# Patient Record
Sex: Female | Born: 1975 | Race: White | Hispanic: No | Marital: Married | Smoking: Never smoker
Health system: Southern US, Community
[De-identification: ages and names within clinical notes are randomized; demographics above are authoritative.]

## PROBLEM LIST (undated history)

## (undated) DIAGNOSIS — R42 Dizziness and giddiness: Secondary | ICD-10-CM

---

## 2000-04-23 ENCOUNTER — Other Ambulatory Visit: Admission: RE | Admit: 2000-04-23 | Discharge: 2000-04-23 | Payer: Self-pay | Admitting: Obstetrics and Gynecology

## 2001-05-25 ENCOUNTER — Other Ambulatory Visit: Admission: RE | Admit: 2001-05-25 | Discharge: 2001-05-25 | Payer: Self-pay | Admitting: Obstetrics and Gynecology

## 2001-05-31 ENCOUNTER — Encounter: Admission: RE | Admit: 2001-05-31 | Discharge: 2001-05-31 | Payer: Self-pay | Admitting: Obstetrics and Gynecology

## 2001-05-31 ENCOUNTER — Encounter: Payer: Self-pay | Admitting: Obstetrics and Gynecology

## 2002-07-05 ENCOUNTER — Other Ambulatory Visit: Admission: RE | Admit: 2002-07-05 | Discharge: 2002-07-05 | Payer: Self-pay | Admitting: Obstetrics and Gynecology

## 2003-08-13 ENCOUNTER — Other Ambulatory Visit: Admission: RE | Admit: 2003-08-13 | Discharge: 2003-08-13 | Payer: Self-pay | Admitting: Obstetrics and Gynecology

## 2004-05-30 ENCOUNTER — Inpatient Hospital Stay (HOSPITAL_COMMUNITY): Admission: AD | Admit: 2004-05-30 | Discharge: 2004-06-03 | Payer: Self-pay | Admitting: Obstetrics and Gynecology

## 2004-05-30 ENCOUNTER — Encounter (INDEPENDENT_AMBULATORY_CARE_PROVIDER_SITE_OTHER): Payer: Self-pay | Admitting: *Deleted

## 2004-07-02 ENCOUNTER — Other Ambulatory Visit: Admission: RE | Admit: 2004-07-02 | Discharge: 2004-07-02 | Payer: Self-pay | Admitting: Obstetrics and Gynecology

## 2005-04-13 ENCOUNTER — Other Ambulatory Visit: Admission: RE | Admit: 2005-04-13 | Discharge: 2005-04-13 | Payer: Self-pay | Admitting: Obstetrics and Gynecology

## 2005-11-04 ENCOUNTER — Inpatient Hospital Stay (HOSPITAL_COMMUNITY): Admission: RE | Admit: 2005-11-04 | Discharge: 2005-11-07 | Payer: Self-pay | Admitting: Obstetrics and Gynecology

## 2010-01-06 ENCOUNTER — Ambulatory Visit: Payer: Self-pay | Admitting: Oncology

## 2010-04-11 ENCOUNTER — Ambulatory Visit: Payer: Self-pay | Admitting: Oncology

## 2010-04-15 LAB — CBC WITH DIFFERENTIAL/PLATELET
BASO%: 0.2 % (ref 0.0–2.0)
Basophils Absolute: 0 10*3/uL (ref 0.0–0.1)
EOS%: 0.6 % (ref 0.0–7.0)
Eosinophils Absolute: 0.1 10*3/uL (ref 0.0–0.5)
HCT: 32.8 % — ABNORMAL LOW (ref 34.8–46.6)
HGB: 11.2 g/dL — ABNORMAL LOW (ref 11.6–15.9)
LYMPH%: 14.2 % (ref 14.0–49.7)
MCH: 31.1 pg (ref 25.1–34.0)
MCHC: 34.1 g/dL (ref 31.5–36.0)
MCV: 91.1 fL (ref 79.5–101.0)
MONO#: 0.4 10*3/uL (ref 0.1–0.9)
MONO%: 4 % (ref 0.0–14.0)
NEUT#: 8.8 10*3/uL — ABNORMAL HIGH (ref 1.5–6.5)
NEUT%: 81 % — ABNORMAL HIGH (ref 38.4–76.8)
Platelets: 102 10*3/uL — ABNORMAL LOW (ref 145–400)
RBC: 3.6 10*6/uL — ABNORMAL LOW (ref 3.70–5.45)
RDW: 13.2 % (ref 11.2–14.5)
WBC: 10.8 10*3/uL — ABNORMAL HIGH (ref 3.9–10.3)
lymph#: 1.5 10*3/uL (ref 0.9–3.3)
nRBC: 0 % (ref 0–0)

## 2010-04-15 LAB — CHCC SMEAR

## 2010-04-15 LAB — LACTATE DEHYDROGENASE: LDH: 128 U/L (ref 94–250)

## 2010-04-23 ENCOUNTER — Encounter (HOSPITAL_COMMUNITY)
Admission: RE | Admit: 2010-04-23 | Discharge: 2010-04-23 | Disposition: A | Discharge status: Home / Self Care | Payer: BC Managed Care – PPO | Source: Ambulatory Visit | Attending: Obstetrics and Gynecology | Admitting: Obstetrics and Gynecology

## 2010-04-23 ENCOUNTER — Other Ambulatory Visit (HOSPITAL_COMMUNITY): Payer: Self-pay

## 2010-04-23 DIAGNOSIS — Z01812 Encounter for preprocedural laboratory examination: Secondary | ICD-10-CM | POA: Insufficient documentation

## 2010-04-28 ENCOUNTER — Other Ambulatory Visit: Payer: Self-pay | Admitting: Obstetrics and Gynecology

## 2010-04-28 ENCOUNTER — Inpatient Hospital Stay (HOSPITAL_COMMUNITY)
Admission: AD | Admit: 2010-04-28 | Discharge: 2010-05-01 | DRG: 370 | Disposition: A | Discharge status: Home / Self Care | Payer: BC Managed Care – PPO | Source: Ambulatory Visit | Attending: Obstetrics and Gynecology | Admitting: Obstetrics and Gynecology

## 2010-04-28 DIAGNOSIS — D689 Coagulation defect, unspecified: Secondary | ICD-10-CM | POA: Diagnosis not present

## 2010-04-28 DIAGNOSIS — D696 Thrombocytopenia, unspecified: Secondary | ICD-10-CM | POA: Diagnosis not present

## 2010-04-28 DIAGNOSIS — O09529 Supervision of elderly multigravida, unspecified trimester: Secondary | ICD-10-CM | POA: Diagnosis present

## 2010-04-28 DIAGNOSIS — O9913 Other diseases of the blood and blood-forming organs and certain disorders involving the immune mechanism complicating the puerperium: Secondary | ICD-10-CM | POA: Diagnosis not present

## 2010-04-28 DIAGNOSIS — Z01812 Encounter for preprocedural laboratory examination: Secondary | ICD-10-CM

## 2010-04-28 DIAGNOSIS — O34219 Maternal care for unspecified type scar from previous cesarean delivery: Principal | ICD-10-CM | POA: Diagnosis present

## 2010-04-28 DIAGNOSIS — Z302 Encounter for sterilization: Secondary | ICD-10-CM

## 2010-04-28 LAB — CBC
HCT: 33.6 % — ABNORMAL LOW (ref 36.0–46.0)
Hemoglobin: 11.3 g/dL — ABNORMAL LOW (ref 12.0–15.0)
MCH: 30.8 pg (ref 26.0–34.0)
MCHC: 33.6 g/dL (ref 30.0–36.0)
MCV: 91.6 fL (ref 78.0–100.0)
Platelets: 108 10*3/uL — ABNORMAL LOW (ref 150–400)
RBC: 3.67 MIL/uL — ABNORMAL LOW (ref 3.87–5.11)
RDW: 13.2 % (ref 11.5–15.5)
WBC: 10.3 10*3/uL (ref 4.0–10.5)

## 2010-04-28 LAB — RPR

## 2010-04-28 LAB — ABO/RH: ABO/RH(D): O POS

## 2010-04-28 LAB — SURGICAL PCR SCREEN: Staphylococcus aureus: POSITIVE — AB

## 2010-04-29 LAB — CBC
HCT: 31.8 % — ABNORMAL LOW (ref 36.0–46.0)
MCH: 31.1 pg (ref 26.0–34.0)
MCHC: 33 g/dL (ref 30.0–36.0)
MCV: 94.1 fL (ref 78.0–100.0)
RDW: 13.5 % (ref 11.5–15.5)

## 2010-04-30 ENCOUNTER — Inpatient Hospital Stay (HOSPITAL_COMMUNITY)
Admission: RE | Admit: 2010-04-30 | Payer: BC Managed Care – PPO | Source: Ambulatory Visit | Admitting: Obstetrics and Gynecology

## 2010-04-30 LAB — CBC
HCT: 30 % — ABNORMAL LOW (ref 36.0–46.0)
MCH: 30.9 pg (ref 26.0–34.0)
MCH: 31.2 pg (ref 26.0–34.0)
MCHC: 32.7 g/dL (ref 30.0–36.0)
MCV: 94.6 fL (ref 78.0–100.0)
MCV: 94.5 fL (ref 78.0–100.0)
Platelets: 85 10*3/uL — ABNORMAL LOW (ref 150–400)
Platelets: 103 10*3/uL — ABNORMAL LOW (ref 150–400)
RBC: 3.46 MIL/uL — ABNORMAL LOW (ref 3.87–5.11)
RDW: 13.5 % (ref 11.5–15.5)
RDW: 13.6 % (ref 11.5–15.5)
WBC: 11.7 10*3/uL — ABNORMAL HIGH (ref 4.0–10.5)

## 2010-05-02 LAB — CROSSMATCH
ABO/RH(D): O POS
Unit division: 0

## 2010-05-04 NOTE — Op Note (Signed)
[name], [name]                  ACCOUNT NO.:  [id number]  MEDICAL RECORD NO.:  [id number]           PATIENT TYPE:  I  LOCATION:  9110                          FACILITY:  [facility]  PHYSICIAN:  Guy Sandifer. Henderson Cloud, M.D. DATE OF BIRTH:  [birth year]  DATE OF PROCEDURE:  04/28/2010 DATE OF DISCHARGE:                              OPERATIVE REPORT   PREOPERATIVE DIAGNOSES: 1. Previous cesarean section. 2. Labor. 3. Desires permanent sterilization.  POSTOPERATIVE DIAGNOSES: 1. Previous cesarean section. 2. Labor. 3. Desires permanent sterilization.  PROCEDURES:  Repeat low transverse cesarean section and bilateral tubal ligation.  SURGEON:  Guy Sandifer. Henderson Cloud, MD  ANESTHESIA:  Spinal, Belva Agee, MD  SPECIMENS:  Bilateral fallopian tube segments to Pathology.  ESTIMATED BLOOD LOSS:  750 mL.  FINDINGS:  Viable female infant, Apgars of 9 and nine at 1 and five minutes respectively, birth weight 8 pounds 8 ounces, arterial cord pH 7.29.  ESTIMATED BLOOD LOSS:  750 mL.  INDICATIONS AND CONSENT:  This patient is a 35 year old married white female, G4, P2, with an EDC of May 07, 2010, placing her at 38-5/7 weeks.  Prenatal care was complicated by previous cesarean section x2. She has advanced maternal age and declined screening.  She has also had low platelets with Hematology consultation.  Diagnosis was benign idiopathic thrombocytopenia pregnancy.  She has had no symptoms of bleeding and platelet counts have been above 100,000.  The patient presents to the office with complaints of contractions and spotting. Cervix is gone from closed and thick to 3-cm dilated, completely effaced and a matter of 2 days with bloody show.  Diagnosis of labor is made. Repeat cesarean section is discussed.  Potential risks and complications were discussed preoperatively.  Potential risks and complications were discussed preoperatively including, but not limited to infection, organ damage,  bleeding requiring transfusion of blood products with HIV and hepatitis acquisition, DVT, PE, pneumonia, pelvic pain, painful intercourse.  Permanent sterilization has been reviewed.  Alternate methods of contraception reviewed.  Permanence of the procedure, increased ectopic risk and failure rate have been reviewed.  All questions have been answered and consent is signed on the chart. Preoperative CBC reveals platelets of 105,000.  She is crossed for 2 units packed red blood cells.  PROCEDURE:  The patient was taken to operating room where she was identified, spinal anesthetic was placed and she was placed in the dorsal supine position with a 15 degrees left lateral wedge.  Time-out was undertaken.  She was prepped, Foley catheter was placed.  The bladder was drained.  She was draped in the sterile fashion.  After testing for adequate spinal anesthesia, skin was entered through a Pfannenstiel incision and dissection was carried out in layers to the peritoneum.  Peritoneum was incised and extended superiorly and inferiorly.  Vesicouterine peritoneum was taken down cephalolaterally. Bladder flap was developed and the bladder blade was placed.  Uterus was incised in a low transverse manner and the uterine cavity was entered bluntly with a hemostat.  Clear fluid was noted.  Uterine incision was extended cephalolaterally with fingers.  Vertex was then delivered.  Oronasopharynx were suctioned.  The remainder of the baby was delivered and good cry and tone was noted.  Cord was clamped and cut and the baby was handed to the awaiting pediatrics team.  Placenta was manually delivered.  Uterine cavity was clean.  Uterus was closed in 2 running locking imbricating layers of 0 Monocryl suture which achieves good hemostasis.  Tubes and ovaries were normal bilaterally.  Left fallopian tube was identified from cornu to fimbria.  It was grasped in its ampullary portion with a Babcock clamp.  A knuckle  of tube was then doubly ligated with 2 free ties of 2-0 plain suture.  The intervening knuckle was then sharply resected.  Cautery was used to assure complete hemostasis.  Similar procedure was carried out on the right side and again hemostasis was noted.  The anterior peritoneum was then closed in running fashion with 0 Monocryl suture which was also used to reapproximate the pyramidalis muscle in the midline.  Anterior rectus fascia was closed in running fashion with a 0 looped PDS suture and the skin was closed with a subcuticular 3-0 Vicryl suture.  Dressings were applied.  All counts were correct.  The patient was awakened and taken to recovery room in stable condition.     Guy Sandifer Henderson Cloud, M.D.     JET/MEDQ  D:  04/28/2010  T:  04/29/2010  Job:  [id number]  Electronically Signed by Harold Hedge M.D. on 05/01/2010 08:27:57 AM

## 2010-05-05 NOTE — Discharge Summary (Signed)
[name], [name]                  ACCOUNT NO.:  [id number]  MEDICAL RECORD NO.:  [id number]           PATIENT TYPE:  I  LOCATION:  9110                          [facility]ACILITY:  [facility]  PHYSICIAN:  Duke Salvia. Marcelle Overlie, M.D.DATE OF BIRTH:  [birth year]  DATE OF ADMISSION:  04/28/2010 DATE OF DISCHARGE:  05/01/2010                              DISCHARGE SUMMARY   ADMITTING DIAGNOSES: 1. Intrauterine pregnancy at 38-5/7th weeks' estimated gestational     age. 2. Spontaneous onset of labor. 3. Previous cesarean section, desires repeat. 4. Multiparity, desires permanent sterilization.  DISCHARGE DIAGNOSES: 1. Status post low transverse cesarean section. 2. Viable female infant. 3. Permanent sterilization.  PROCEDURES: 1. Repeat low transverse cesarean section. 2. Bilateral tubal ligation.  REASON FOR ADMISSION:  Please see written H and P.  HOSPITAL COURSE:  The patient is a 35 year old white married female gravida 4, para 2, that was admitted to [facility] at 59- 5/7 weeks' estimated gestational age with spontaneous onset of labor. The patient had had a previous cesarean section x2 and did desire repeat.  On admission, vital signs were stable.  Cervix was examined and found to be 3-cm dilated, completely effaced at -2 station.  CBC was drawn at that time and platelets were known to be 105,000.  The patient was then taken to the operating room where spinal anesthesia was administered without difficulty.  A low transverse incision was made with delivery of a viable female infant, weighing 8 pounds 8 ounces with Apgars of 9 at 1 minute and 9 at 5 minutes.  Arterial cord pH was 7.29. Bilateral tubal ligation was performed without difficulty and the patient tolerated the procedure well and was taken to the recovery room in stable condition.  On postoperative day #1, the patient was without complaint.  Vital signs were stable.  Blood pressure was 97/55 to 104/58.  Abdomen was  soft with good return of bowel function.  Fundus was firm and nontender.  Abdominal dressing was noted to have a small amount of drainage noted on the bandage.  Foley had been discontinued. She was voiding well.  Laboratory findings revealed hemoglobin of 10.5 and platelet count was 87,000.  PCA pump was discontinued.  The patient was allowed to shower.  Motrin was held and CBC was ordered for the following morning.  On postoperative day #2, the patient was without complaint.  She was complaining that was she was having some sleepiness on the Percocet.  Vital signs were stable.  Abdomen was soft.  Fundus was firm and nontender.  Incision was clean, dry, and intact with subcuticular closure.  She was ambulating well.  Platelets had been redrawn and known to be down to 85,000.  Once again, Motrin was held and CBC was ordered for later in the afternoon and pain medication was changed to Vicodin.  On postoperative day #3, the patient was without complaint.  Vital signs were stable.  Abdomen was soft.  Fundus was firm and nontender.  Incision was clean, dry, and intact.  Platelets up to 103,000.  Discharge instructions reviewed and the patient was later discharged  home.  CONDITION ON DISCHARGE:  Stable.  DIET:  Regular as tolerated.  ACTIVITY:  No heavy lifting, no driving x2 weeks, no vaginal entry.  FOLLOWUP:  The patient is to follow up in the office in 2 weeks for an incision check.  She is to call for temperature greater than 100 degrees, persistent nausea, vomiting, heavy vaginal bleeding, and/or redness or drainage from incisional site.  DISCHARGE MEDICATIONS: 1. Vicodin #30, 1 p.o. every 4-6 hours p.r.n. 2. Motrin 600 mg every 6 hours. 3. Prenatal vitamins 1 p.o. daily.     Julio Sicks, N.P.   ______________________________ Duke Salvia. Marcelle Overlie, M.D.    CC/MEDQ  D:  05/01/2010  T:  05/01/2010  Job:  [id number]  Electronically Signed by Julio Sicks N.P. on 05/05/2010  09:21:45 AM Electronically Signed by Richarda Overlie M.D. on 05/05/2010 08:26:00 PM

## 2010-06-09 LAB — CBC
HCT: 35.7 % — ABNORMAL LOW (ref 36.0–46.0)
Hemoglobin: 11.6 g/dL — ABNORMAL LOW (ref 12.0–15.0)
MCH: 30.2 pg (ref 26.0–34.0)
MCHC: 32.5 g/dL (ref 30.0–36.0)
MCV: 93 fL (ref 78.0–100.0)
RDW: 13.4 % (ref 11.5–15.5)

## 2010-06-13 ENCOUNTER — Other Ambulatory Visit: Payer: Self-pay | Admitting: Dermatology

## 2010-07-22 ENCOUNTER — Inpatient Hospital Stay (HOSPITAL_COMMUNITY): Admission: AD | Admit: 2010-07-22 | Payer: Self-pay | Source: Home / Self Care | Admitting: Obstetrics and Gynecology

## 2010-08-08 NOTE — Op Note (Signed)
NAMECAITRIONA, [name]                  ACCOUNT NO.:  [id number]   MEDICAL RECORD NO.:  [id number]          PATIENT TYPE:  INP   LOCATION:  9132                          FACILITY:  [facility]   PHYSICIAN:  Zelphia Cairo, MD    DATE OF BIRTH:  13-Jan-1976   DATE OF PROCEDURE:  11/04/2005  DATE OF DISCHARGE:                                 OPERATIVE REPORT   PREOPERATIVE DIAGNOSES:  1. Failed vaginal birth after cesarean.  2. Failure to descend.   POSTOPERATIVE DIAGNOSES:  1. Failed vaginal birth after cesarean.  2. Failure to descend.   PROCEDURE:  Repeat low transverse cesarean delivery.   SURGEON:  Dr. Renaldo Fiddler.   ESTIMATED BLOOD LOSS:  800 mL.   URINE OUTPUT:  250 mL.   COMPLICATIONS:  None.   FINDINGS:  Viable female infant with Apgars of 9 and 9.  Cord pH of 7.27.  Normal appearing pelvic anatomy.   ANESTHESIA:  Epidural.   CONDITION:  Stable  to recovery room.   INDICATIONS:  [name] is 35 year old G2, P1, who presented at 39-plus weeks  for induction of labor.  She was ruptured with clear fluid this morning, and  her cervix changed from 3-4 cm to completely dilated without augmentation.  After pushing for 2 hours, the fetal vertex did not descend past zero  station.  Risks, benefits and alternatives of repeat C-section were  discussed with the patient, and informed consent was obtained.   PROCEDURE:  The patient was taken to the operating room where epidural  anesthesia was found to be adequate.  She was prepped and draped in sterile  fashion.  A Foley catheter was placed.  The skin incision was made with a  scalpel, carried down underlying fascia.  The fascia was incised in the  midline and extended laterally using Mayo scissors.  The fascia was then  grasped superiorly and tented upwards, and dissected off the underlying  rectus muscles using the Bovie.  The fascia was then grasped inferiorly with  Kocher clamps, tented upwards and dissected off using the Bovie.  The  peritoneum was then identified and entered bluntly.  The bladder blade was  inserted and the bladder flap was created.  The uterine incision was then  made with the scalpel and extended bluntly.  The fetal vertex was very  extended and deep in the pelvis.  The fetal head was brought to the incision  and was flexed and delivered through the uterine incision.  The shoulders  and body followed.  The cord was clamped and cut, and the infant was handed  to waiting pediatricians.  The placenta was then removed from the uterus,  and the uterus was cleared of all clots and debris with a clean lap.  The  uterine incision was then closed with chromic in a running locked fashion  and hemostasis was assured.  Uterus was returned to the pelvis.  Peritoneum  was closed with Vicryl and the fascia was closed with looped PDS.  The skin  was then closed with staples.  The patient tolerated the procedure well.  Sponge, lap, needle and instrument counts were correct x2.  She did receive  2 grams of Ancef, and was taken to the recovery room in stable condition.      Zelphia Cairo, MD  Electronically Signed     GA/MEDQ  D:  11/04/2005  T:  11/04/2005  Job:  [id number]

## 2010-08-08 NOTE — Discharge Summary (Signed)
[name], [name]                  ACCOUNT NO.:  [id number]   MEDICAL RECORD NO.:  [id number]          PATIENT TYPE:  INP   LOCATION:  9132                          [facility]ACILITY:  [facility]   PHYSICIAN:  Dineen Kid. Rana Snare, M.D.    DATE OF BIRTH:  1976/03/08   DATE OF ADMISSION:  11/04/2005  DATE OF DISCHARGE:  11/07/2005                                 DISCHARGE SUMMARY   ADMITTING DIAGNOSES:  1. Intrauterine pregnancy at 59 weeks estimated gestational age.  2. Induction of labor.  3. History of previous cesarean section, desires vaginal birth after      cesarean.   DISCHARGE DIAGNOSES:  1. Status post low transverse cesarean section secondary to failure to      descend.  2. Viable female infant.   PROCEDURE:  Repeat low transverse cesarean section.   REASON FOR ADMISSION:  Please see written H&P.   HOSPITAL COURSE:  The patient was a 35 year old gravida 2, para 1 that  presented to [facility] at 39 weeks estimated gestational  age for induction of labor.  The patient had had a previous cesarean section  but desired vaginal birth after cesarean.  On admission cervix was dilated 3-  4 cm, 90%, vertex at -2 station.  Artificial rupture of membranes was  performed with clear fluid.  The patient did progress to complete dilatation  with vertex at a 0 station.  After pushing for approximately 2 hours with no  further descent of the fetal vertex with a moderate amount of molding,  decision was made to proceed with a repeat low transverse cesarean section.  The patient was then transferred to the operating room where epidural was  dosed to an adequate surgical level.  A low transverse incision was made  with the delivery of a viable female infant weighing 8 pounds 4 ounces with  Apgars of 9 at one minute and 9 at five minutes.  The patient tolerated the  procedure well and was taken to the recovery room in stable condition.  On  postoperative day #1 the patient did complain of  some drowsiness.  She had  been given Benadryl for itching.  Vital signs were otherwise stable, she was  afebrile.  Abdomen soft with good return of bowel function.  Fundus was firm  and nontender.  Abdominal dressing was noted to have a small amount of  drainage noted on the bandage.  Laboratory findings showed hemoglobin of  9.8; platelet count was down to 90,000.  Decision was made to hold the  Motrin at this current time, remove the abdominal dressing, and ordered a  CBC for in the morning.  On the following morning the patient was without  complaint.  Vital signs remained stable, she was afebrile.  Fundus firm and  nontender.  Abdominal dressing had been removed revealing an incision that  was clean, dry and intact.  Laboratory findings revealed hemoglobin of 9.9;  platelet count up to 103,000.  On postoperative day #3 the patient was  without complaint.  Vital signs were stable and she was afebrile.  Fundus  firm and nontender.  Incision was clean, dry and intact.  Staples were  removed and the patient was later discharged home.   CONDITION ON DISCHARGE:  Good.   DIET:  Regular as tolerated.   ACTIVITY:  No heavy lifting, no driving x2 weeks, no vaginal entry.   FOLLOWUP:  The patient is to follow up in the office in 1-2 weeks for an  incision check.  She is to call for temperature greater than 100 degrees,  persistent nausea/vomiting, heavy vaginal bleeding, and/or redness or  drainage from the incisional site.   DISCHARGE MEDICATIONS:  1. Percocet 5/325 #30 one p.o. every 4-6 hours p.r.n.  2. Motrin 600 mg every 6 hours.  3. Prenatal vitamins one p.o. daily.      Julio Sicks, N.P.      Dineen Kid Rana Snare, M.D.  Electronically Signed    CC/MEDQ  D:  12/01/2005  T:  12/01/2005  Job:  [id number]

## 2011-03-04 ENCOUNTER — Other Ambulatory Visit: Payer: Self-pay | Admitting: Dermatology

## 2012-03-04 ENCOUNTER — Other Ambulatory Visit: Payer: Self-pay | Admitting: Dermatology

## 2013-05-05 ENCOUNTER — Other Ambulatory Visit: Payer: Self-pay | Admitting: Dermatology

## 2013-05-25 ENCOUNTER — Encounter (HOSPITAL_COMMUNITY): Payer: Self-pay | Admitting: Emergency Medicine

## 2013-05-25 ENCOUNTER — Emergency Department (HOSPITAL_COMMUNITY)
Admission: EM | Admit: 2013-05-25 | Discharge: 2013-05-25 | Disposition: A | Discharge status: Home / Self Care | Payer: BC Managed Care – PPO | Attending: Emergency Medicine | Admitting: Emergency Medicine

## 2013-05-25 DIAGNOSIS — S060X0A Concussion without loss of consciousness, initial encounter: Secondary | ICD-10-CM | POA: Insufficient documentation

## 2013-05-25 DIAGNOSIS — W1809XA Striking against other object with subsequent fall, initial encounter: Secondary | ICD-10-CM | POA: Insufficient documentation

## 2013-05-25 DIAGNOSIS — H8190 Unspecified disorder of vestibular function, unspecified ear: Secondary | ICD-10-CM

## 2013-05-25 DIAGNOSIS — S060XAA Concussion with loss of consciousness status unknown, initial encounter: Secondary | ICD-10-CM

## 2013-05-25 DIAGNOSIS — Y9323 Activity, snow (alpine) (downhill) skiing, snow boarding, sledding, tobogganing and snow tubing: Secondary | ICD-10-CM | POA: Insufficient documentation

## 2013-05-25 DIAGNOSIS — Y929 Unspecified place or not applicable: Secondary | ICD-10-CM | POA: Insufficient documentation

## 2013-05-25 DIAGNOSIS — S060X9A Concussion with loss of consciousness of unspecified duration, initial encounter: Secondary | ICD-10-CM

## 2013-05-25 HISTORY — DX: Dizziness and giddiness: R42

## 2013-05-25 MED ORDER — MECLIZINE HCL 25 MG PO TABS: 25.0000 mg | ORAL_TABLET | Freq: Three times a day (TID) | ORAL | Status: AC | PRN

## 2013-05-25 NOTE — ED Notes (Signed)
Per pt, states she fell while skiing last Friday and hit back of head-suffers from vertigo and thought dizziness was related to her vertigo-called PCP and was told to come here for eval

## 2013-05-25 NOTE — Discharge Instructions (Signed)

## 2013-05-25 NOTE — ED Notes (Signed)
Initial Contact - pt to [facility]5 with c/o dizziness x6 days after a fall while skiing.  Pt reports hitting head, denies LOC, pt sts hx vertigo and symptoms are the same.  Pt sts told to come to ER by PCP.  Neuros grossly intact, ambulatory with a steady gait.  Speaking full/clear sentences, rr even/un-lab.  Skin PWD.  NAD.

## 2013-05-25 NOTE — Progress Notes (Signed)
   CARE MANAGEMENT ED NOTE 05/25/2013  Patient:  [name],[name]   Account Number:  [id number]  Date Initiated:  05/25/2013  Documentation initiated by:  Radford PaxFERRERO,Thaddus Mcdowell  Subjective/Objective Assessment:   Patient presents to Ed with head injury while skiing.     Subjective/Objective Assessment Detail:   Patient complains of vertigo and dizziness     Action/Plan:   Action/Plan Detail:   Anticipated DC Date:  05/25/2013     Status Recommendation to Physician:   Result of Recommendation:    Other ED Services  Consult Working Plan    DC Planning Services  Other  PCP issues    Choice offered to / List presented to:            Status of service:  Completed, signed off  ED Comments:   ED Comments Detail:  Patient confirms her pcp is Dr. Buren Kosouglas Shaw.  System updated.

## 2013-05-25 NOTE — ED Provider Notes (Signed)
CSN: [id number]     Arrival date & time 05/25/13  1353 History   First MD Initiated Contact with Patient 05/25/13 1522     Chief Complaint  Patient presents with  . Dizziness       The history is provided by the patient.   patient presents to the emergency department because of ongoing dizziness and vertiginous symptoms since injuring her head one week ago while skiing.  She fell up proximally 6 days ago while skiing and fell and struck the back of her head.  Chin no loss consciousness.  She is not on anticoagulants.  She was not wearing a helmet at the time.  Since then she states that she's felt chest not quite right and somewhat vertiginous in nature.  She denies significant headache.  She's been able to do her normal activities otherwise.  She's been caring for her children and dressing herself in shower.  She drove to the emergency department today.  She spoke with her primary care doctor's office today and talked with the nurse who recommended that she come to the emergency department because she may require imaging.  She denies weakness in arms or legs.  She denies neck pain.  No other complaints.  She states she's had vertiginous symptoms many times before in the past such as when she goes on a boat.  She's received meclizine before in the past but states this makes her extremely sleepy and she prefers not to take this.  Her mother is a Engineer, civil (consulting)nurse and her mother was concerned about the possibility of an intercranial bleed and thought that she would benefit from imaging as well.  Past Medical History  Diagnosis Date  . Vertigo    Past Surgical History  Procedure Laterality Date  . Cesarean section     No family history on file. History  Substance Use Topics  . Smoking status: Never Smoker   . Smokeless tobacco: Not on file  . Alcohol Use: No   OB History   Grav Para Term Preterm Abortions TAB SAB Ect Mult Living                 Review of Systems  All other systems reviewed and are  negative.      Allergies  Biaxin  Home Medications   Current Outpatient Rx  Name  Route  Sig  Dispense  Refill  . ibuprofen (ADVIL,MOTRIN) 200 MG tablet   Oral   Take 200 mg by mouth every 6 (six) hours as needed for moderate pain.         . Multiple Vitamin (MULTIVITAMIN WITH MINERALS) TABS tablet   Oral   Take 1 tablet by mouth daily.          BP 112/59  Pulse 73  Temp(Src) 98.2 F (36.8 C) (Oral)  Resp 16  SpO2 99%  LMP 05/18/2013 Physical Exam  Nursing note and vitals reviewed. Constitutional: She is oriented to person, place, and time. She appears well-developed and well-nourished. No distress.  HENT:  Head: Normocephalic and atraumatic.  Right Ear: External ear normal.  Left Ear: External ear normal.  Mouth/Throat: Oropharynx is clear and moist.  Eyes: EOM are normal.  Neck: Normal range of motion.  Cardiovascular: Normal rate, regular rhythm and normal heart sounds.   Pulmonary/Chest: Effort normal and breath sounds normal.  Abdominal: Soft. She exhibits no distension. There is no tenderness.  Musculoskeletal: Normal range of motion.  Neurological: She is alert and oriented to person, place,  and time.  Skin: Skin is warm and dry.  Psychiatric: She has a normal mood and affect. Judgment normal.    ED Course  Procedures (including critical care time) Labs Review Labs Reviewed - No data to display Imaging Review No results found.   EKG Interpretation None      MDM   Final diagnoses:  Concussion  Vertiginous syndrome    Concussion with some vertiginous symptoms. Doubt stroke, doubt central vertigo. Doubt intracranial bleed.  Overall the patient is very well appearing.  The patient requested that we attempted Epley maneuver and therefore this was performed.  She had recurrence of her vertiginous symptoms with laying flat and turning her head to the left.  She was given information regarding the Epley maneuver and she will continue these  exercises at home.  She's been given a prescription for meclizine which she may try in the evening time as she states this will also help her sleep.  I've asked that she return to the ER for any new or worsening symptoms and asked that she followup with her primary care physician.    Lyanne Co, MD 05/26/13 [phone]

## 2014-02-21 ENCOUNTER — Other Ambulatory Visit: Payer: Self-pay | Admitting: Obstetrics and Gynecology

## 2014-02-22 LAB — CYTOLOGY - PAP

## 2016-02-12 DIAGNOSIS — Z23 Encounter for immunization: Secondary | ICD-10-CM | POA: Diagnosis not present

## 2016-05-04 DIAGNOSIS — Z6826 Body mass index (BMI) 26.0-26.9, adult: Secondary | ICD-10-CM | POA: Diagnosis not present

## 2016-05-04 DIAGNOSIS — Z1231 Encounter for screening mammogram for malignant neoplasm of breast: Secondary | ICD-10-CM | POA: Diagnosis not present

## 2016-05-04 DIAGNOSIS — Z01419 Encounter for gynecological examination (general) (routine) without abnormal findings: Secondary | ICD-10-CM | POA: Diagnosis not present

## 2016-05-05 ENCOUNTER — Other Ambulatory Visit: Payer: Self-pay | Admitting: Obstetrics and Gynecology

## 2016-05-05 DIAGNOSIS — R928 Other abnormal and inconclusive findings on diagnostic imaging of breast: Secondary | ICD-10-CM

## 2016-05-11 ENCOUNTER — Ambulatory Visit
Admission: RE | Admit: 2016-05-11 | Discharge: 2016-05-11 | Disposition: A | Discharge status: Home / Self Care | Payer: BLUE CROSS/BLUE SHIELD | Source: Ambulatory Visit | Attending: Obstetrics and Gynecology | Admitting: Obstetrics and Gynecology

## 2016-05-11 DIAGNOSIS — R928 Other abnormal and inconclusive findings on diagnostic imaging of breast: Secondary | ICD-10-CM

## 2016-05-11 DIAGNOSIS — N6011 Diffuse cystic mastopathy of right breast: Secondary | ICD-10-CM | POA: Diagnosis not present

## 2016-05-11 DIAGNOSIS — N6311 Unspecified lump in the right breast, upper outer quadrant: Secondary | ICD-10-CM | POA: Diagnosis not present

## 2016-08-12 DIAGNOSIS — D2262 Melanocytic nevi of left upper limb, including shoulder: Secondary | ICD-10-CM | POA: Diagnosis not present

## 2016-08-12 DIAGNOSIS — D2261 Melanocytic nevi of right upper limb, including shoulder: Secondary | ICD-10-CM | POA: Diagnosis not present

## 2016-08-12 DIAGNOSIS — Z85828 Personal history of other malignant neoplasm of skin: Secondary | ICD-10-CM | POA: Diagnosis not present

## 2016-08-12 DIAGNOSIS — L738 Other specified follicular disorders: Secondary | ICD-10-CM | POA: Diagnosis not present

## 2016-12-25 DIAGNOSIS — Z23 Encounter for immunization: Secondary | ICD-10-CM | POA: Diagnosis not present

## 2017-03-01 DIAGNOSIS — M5442 Lumbago with sciatica, left side: Secondary | ICD-10-CM | POA: Diagnosis not present

## 2017-08-25 DIAGNOSIS — N390 Urinary tract infection, site not specified: Secondary | ICD-10-CM | POA: Diagnosis not present

## 2017-11-04 ENCOUNTER — Other Ambulatory Visit: Payer: Self-pay | Admitting: Radiology

## 2017-11-04 DIAGNOSIS — N6320 Unspecified lump in the left breast, unspecified quadrant: Secondary | ICD-10-CM

## 2017-11-09 ENCOUNTER — Ambulatory Visit
Admission: RE | Admit: 2017-11-09 | Discharge: 2017-11-09 | Disposition: A | Discharge status: Home / Self Care | Payer: BLUE CROSS/BLUE SHIELD | Source: Ambulatory Visit | Attending: Radiology | Admitting: Radiology

## 2017-11-09 DIAGNOSIS — N6320 Unspecified lump in the left breast, unspecified quadrant: Secondary | ICD-10-CM

## 2017-11-09 DIAGNOSIS — R922 Inconclusive mammogram: Secondary | ICD-10-CM | POA: Diagnosis not present

## 2017-11-18 DIAGNOSIS — Z6827 Body mass index (BMI) 27.0-27.9, adult: Secondary | ICD-10-CM | POA: Diagnosis not present

## 2017-11-18 DIAGNOSIS — Z01419 Encounter for gynecological examination (general) (routine) without abnormal findings: Secondary | ICD-10-CM | POA: Diagnosis not present

## 2017-12-29 DIAGNOSIS — D225 Melanocytic nevi of trunk: Secondary | ICD-10-CM | POA: Diagnosis not present

## 2017-12-29 DIAGNOSIS — D2262 Melanocytic nevi of left upper limb, including shoulder: Secondary | ICD-10-CM | POA: Diagnosis not present

## 2017-12-29 DIAGNOSIS — Z85828 Personal history of other malignant neoplasm of skin: Secondary | ICD-10-CM | POA: Diagnosis not present

## 2018-03-10 DIAGNOSIS — N390 Urinary tract infection, site not specified: Secondary | ICD-10-CM | POA: Diagnosis not present

## 2018-03-18 DIAGNOSIS — N200 Calculus of kidney: Secondary | ICD-10-CM | POA: Diagnosis not present

## 2018-03-18 DIAGNOSIS — Z881 Allergy status to other antibiotic agents status: Secondary | ICD-10-CM | POA: Diagnosis not present

## 2018-03-18 DIAGNOSIS — N1330 Unspecified hydronephrosis: Secondary | ICD-10-CM | POA: Diagnosis not present

## 2018-03-18 DIAGNOSIS — R109 Unspecified abdominal pain: Secondary | ICD-10-CM | POA: Diagnosis not present

## 2018-03-18 DIAGNOSIS — N201 Calculus of ureter: Secondary | ICD-10-CM | POA: Diagnosis not present

## 2018-10-03 DIAGNOSIS — R82998 Other abnormal findings in urine: Secondary | ICD-10-CM | POA: Diagnosis not present

## 2018-10-03 DIAGNOSIS — Z0000 Encounter for general adult medical examination without abnormal findings: Secondary | ICD-10-CM | POA: Diagnosis not present

## 2018-10-05 DIAGNOSIS — Z0000 Encounter for general adult medical examination without abnormal findings: Secondary | ICD-10-CM | POA: Diagnosis not present

## 2018-10-05 DIAGNOSIS — Z1331 Encounter for screening for depression: Secondary | ICD-10-CM | POA: Diagnosis not present

## 2018-12-09 IMAGING — MG DIGITAL DIAGNOSTIC BILATERAL MAMMOGRAM WITH TOMO AND CAD
6 of 12 series · 6 of 36 positions shown · non-contrast
Comparison: Previous exam(s).

CLINICAL DATA: Palpable lump in the left breast

EXAM:
DIGITAL DIAGNOSTIC BILATERAL MAMMOGRAM WITH TOMO
ULTRASOUND LEFT BREAST

[R MLO synth-2D (1 of 2)]
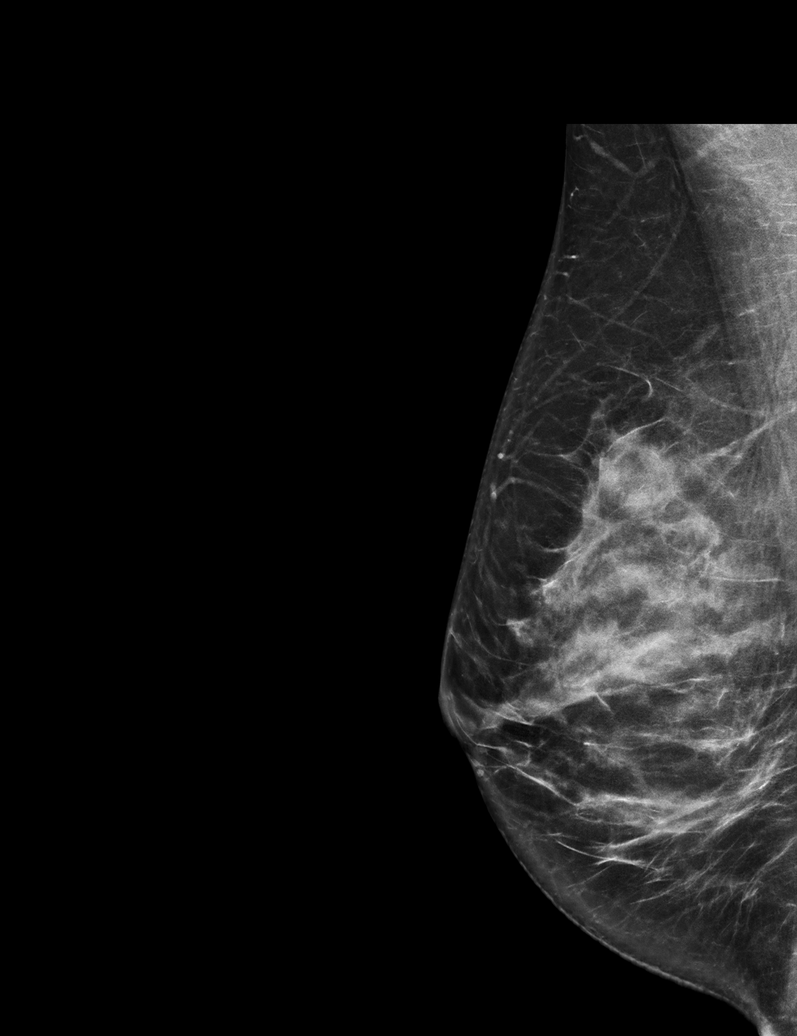

[R MLO synth-2D (2 of 2)]
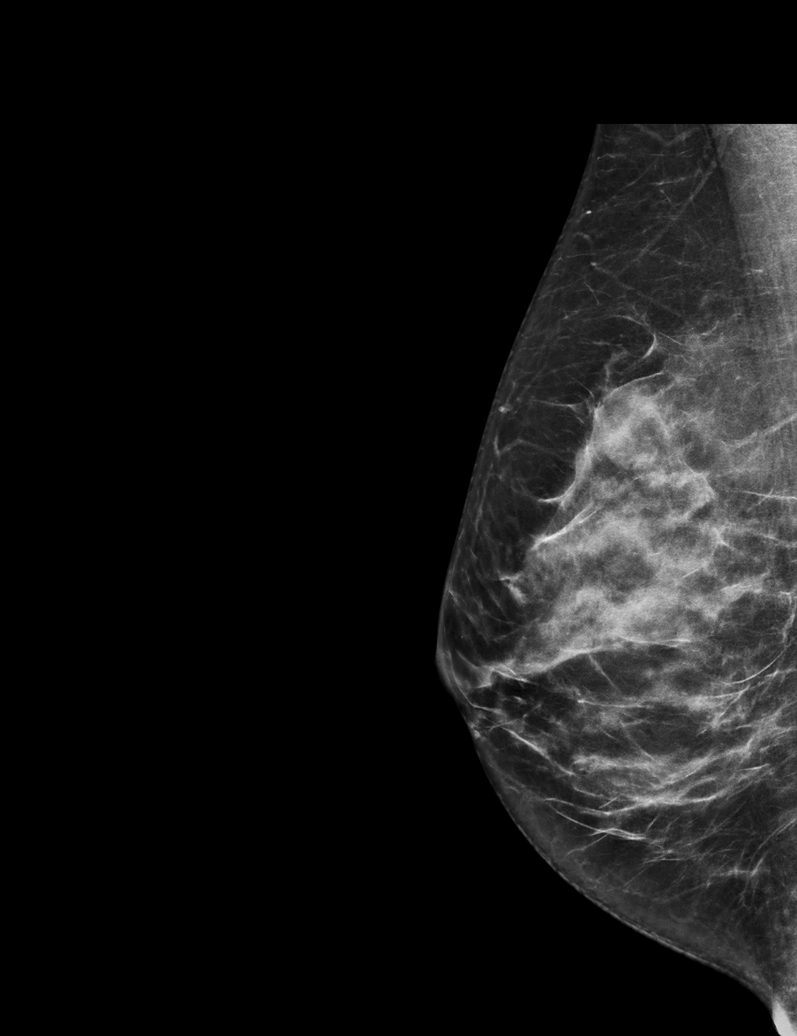

[L MLO synth-2D]
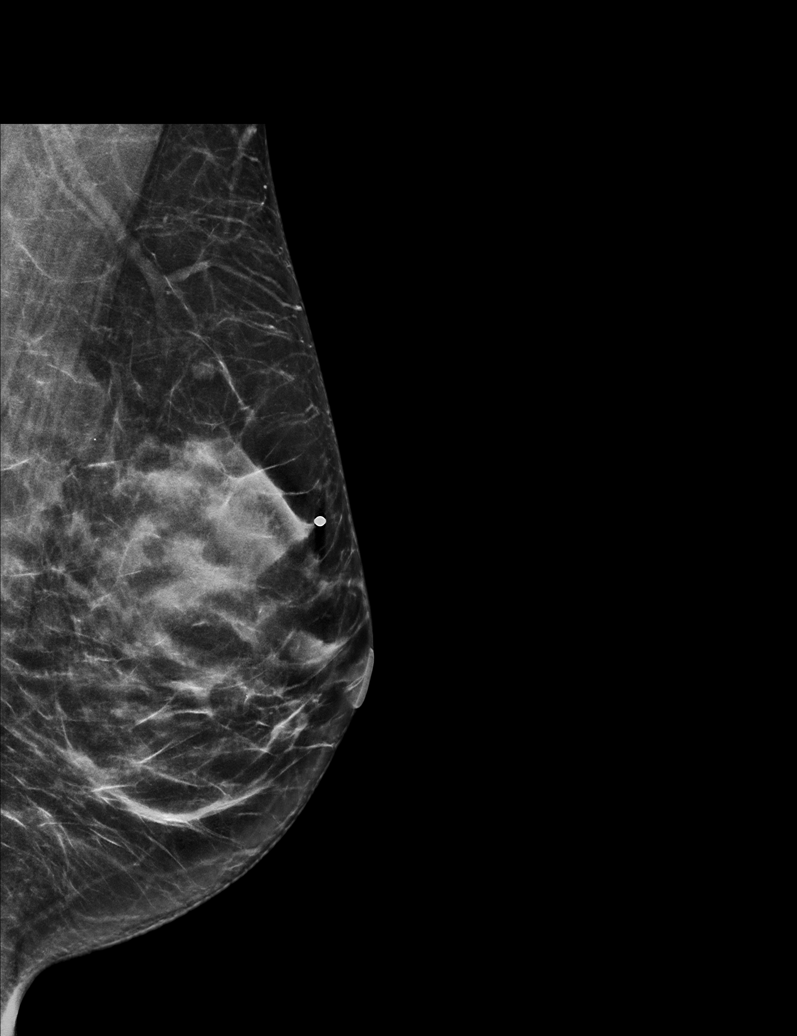

[R CC synth-2D]
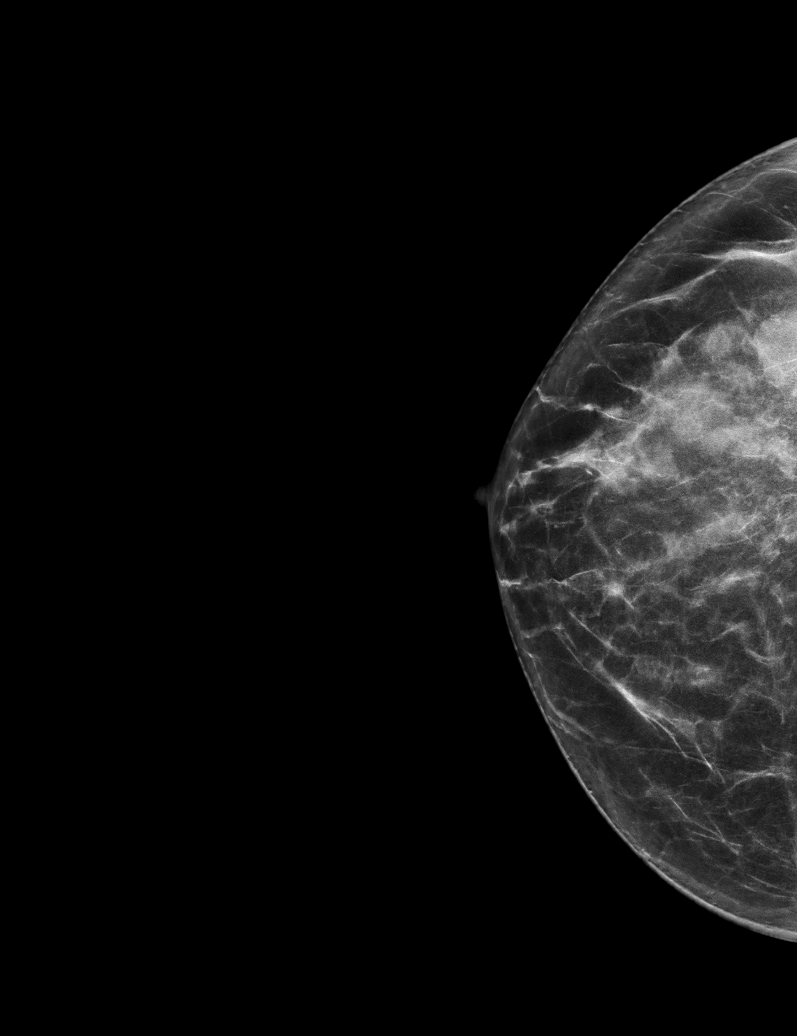

[L CC synth-2D (1 of 2)]
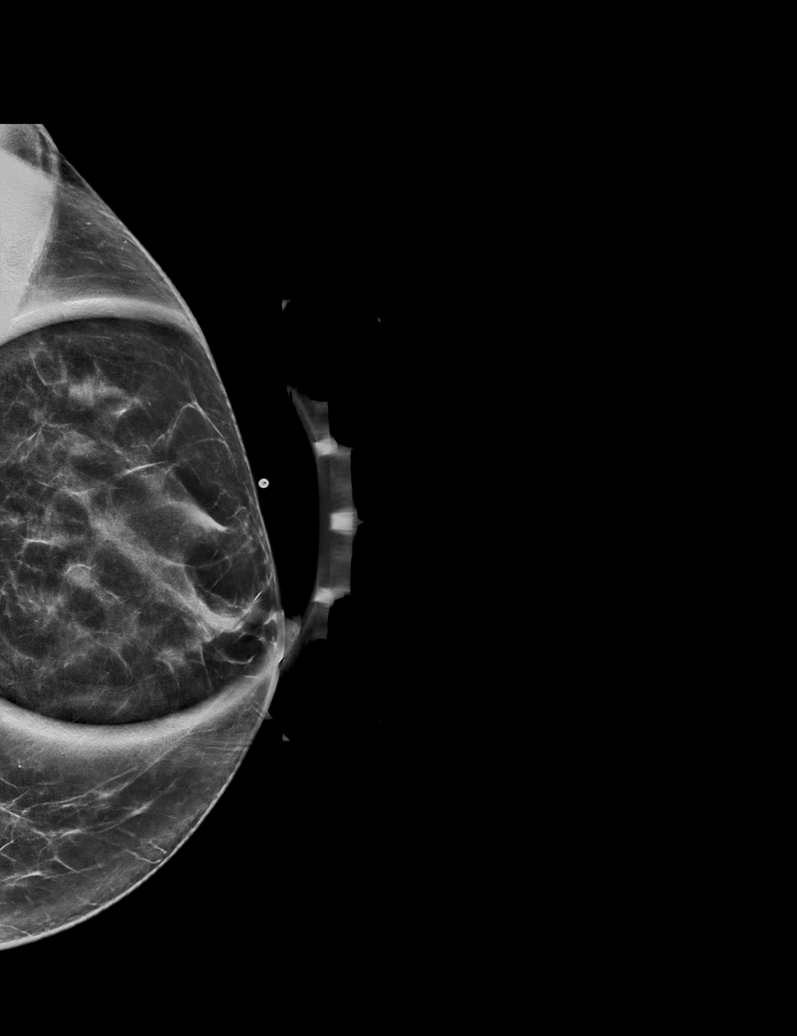

[L CC synth-2D (2 of 2)]
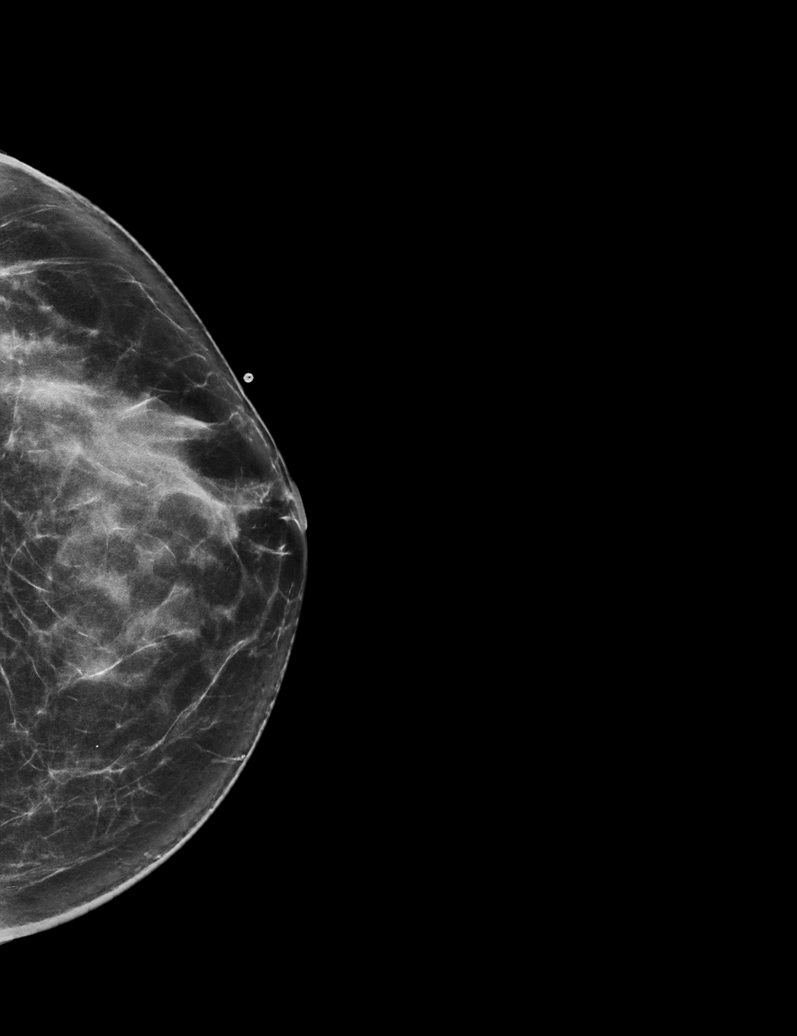

[6 of 36 positions shown; findings below may reference images not displayed]

ACR Breast Density Category c: The breast tissue is heterogeneously
dense, which may obscure small masses.
FINDINGS: There is a waxing and waning bilateral cystic pattern. No suspicious
masses identified.

On physical exam, a lump is felt on the left.

Targeted ultrasound is performed, showing a simple cyst correlating
with the patient's palpable lump.
IMPRESSION: Fibrocystic changes.  No evidence of malignancy.

RECOMMENDATION:
Annual screening mammography.

I have discussed the findings and recommendations with the patient.
Results were also provided in writing at the conclusion of the
visit. If applicable, a reminder letter will be sent to the patient
regarding the next appointment.

BI-RADS CATEGORY  2: Benign.

## 2018-12-09 IMAGING — US ULTRASOUND LEFT BREAST LIMITED
1 series · 6 of 6 positions shown · non-contrast
Comparison: Previous exam(s).

CLINICAL DATA: Palpable lump in the left breast

EXAM:
DIGITAL DIAGNOSTIC BILATERAL MAMMOGRAM WITH TOMO
ULTRASOUND LEFT BREAST

[Series 1: ultrasound left breast limited · 0.07mm/px · 6 of 6 slices shown]
[im 1/6]
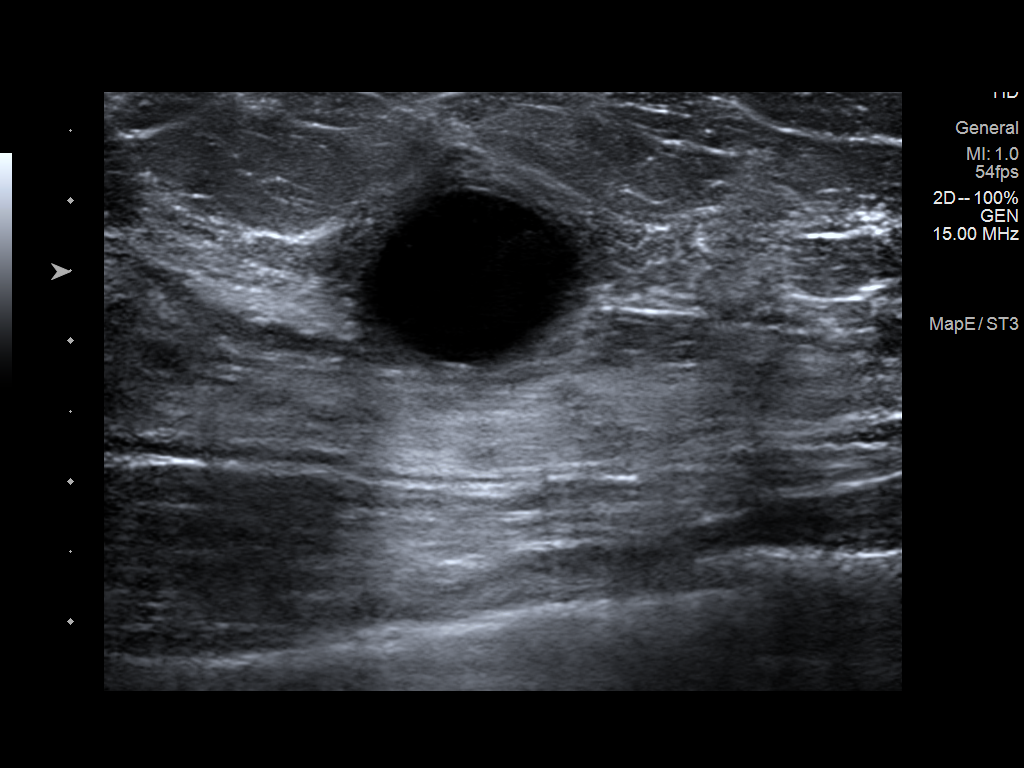
[im 2/6]
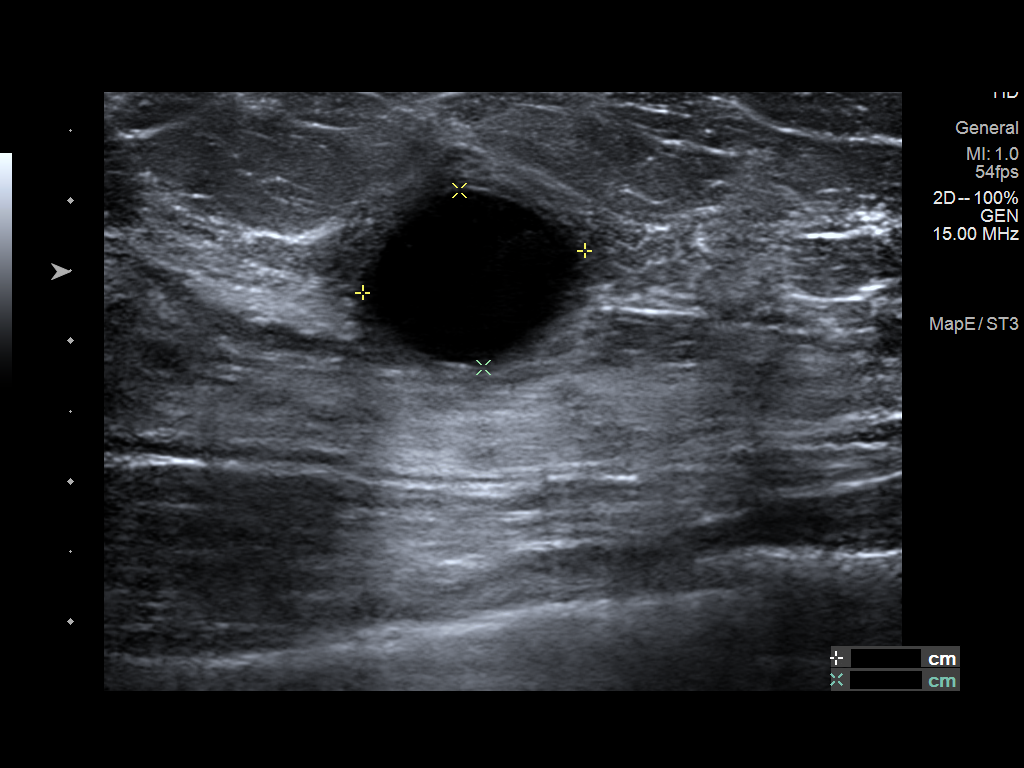
[im 3/6]
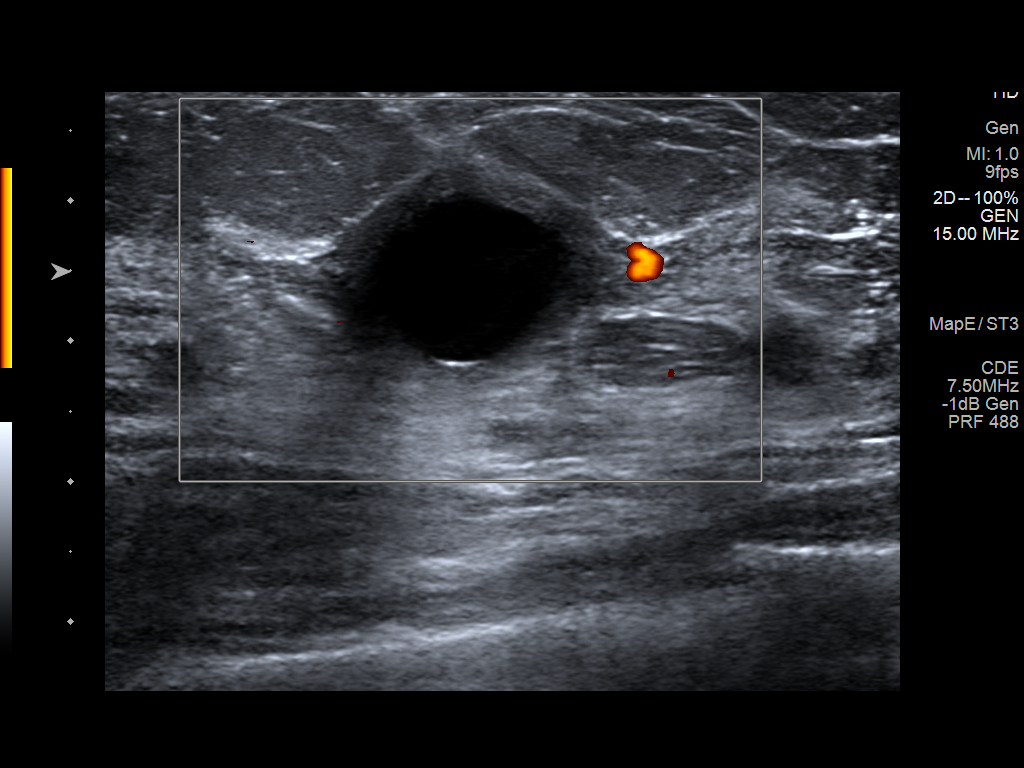
[im 4/6]
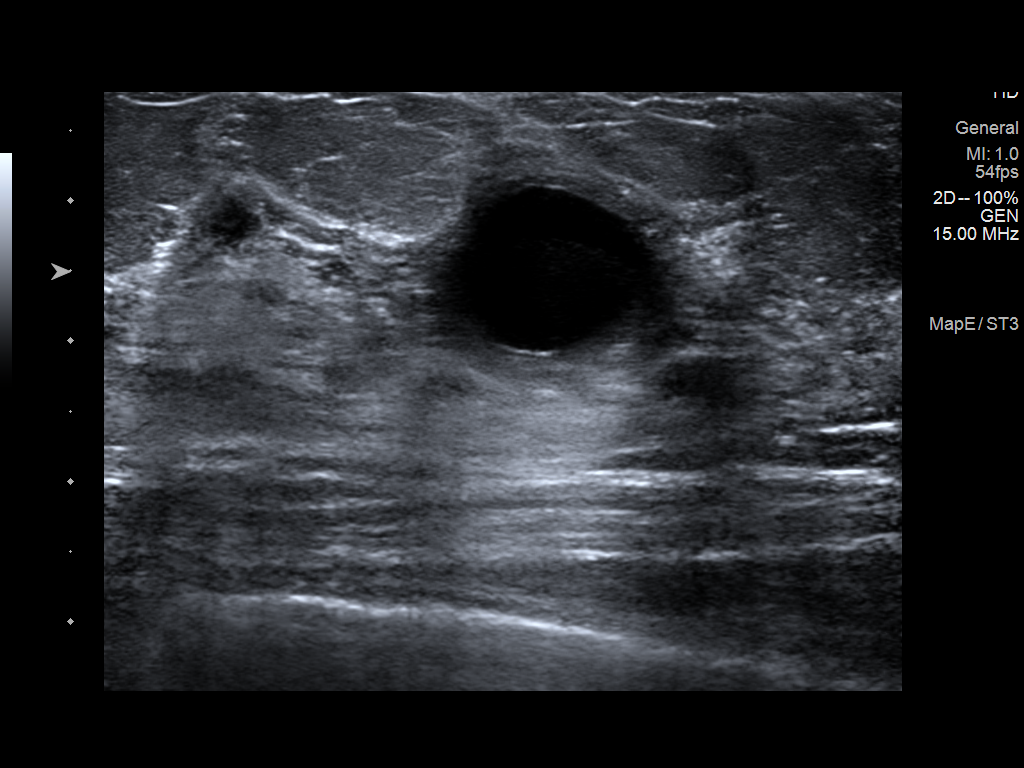
[im 5/6]
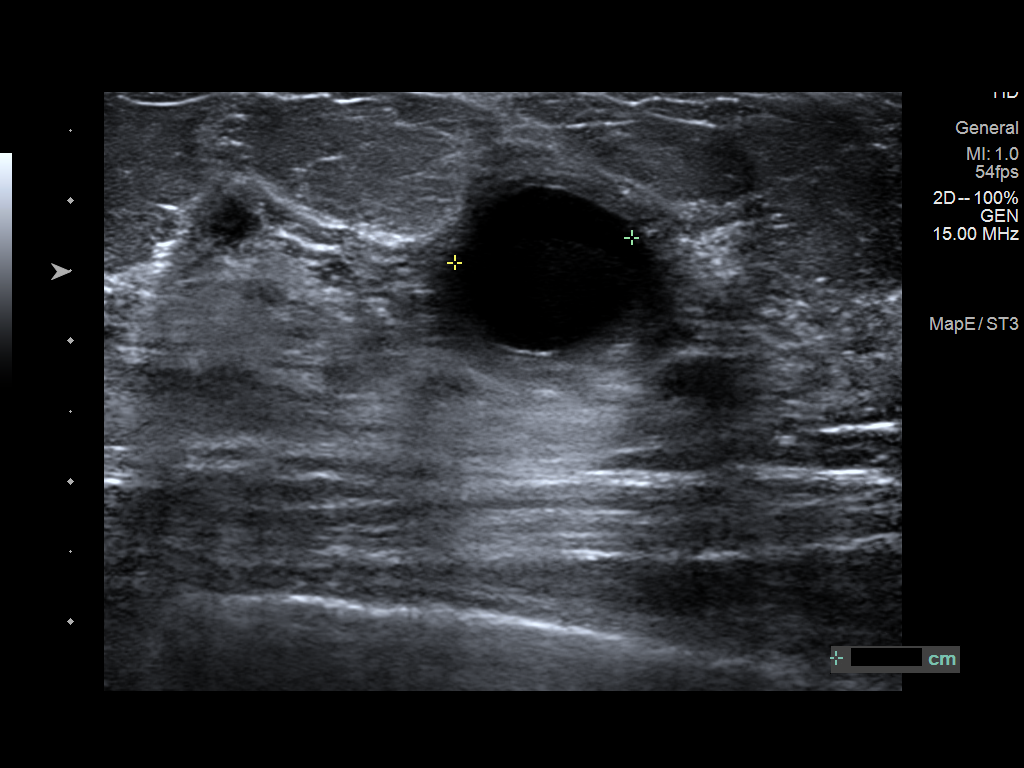
[im 6/6]
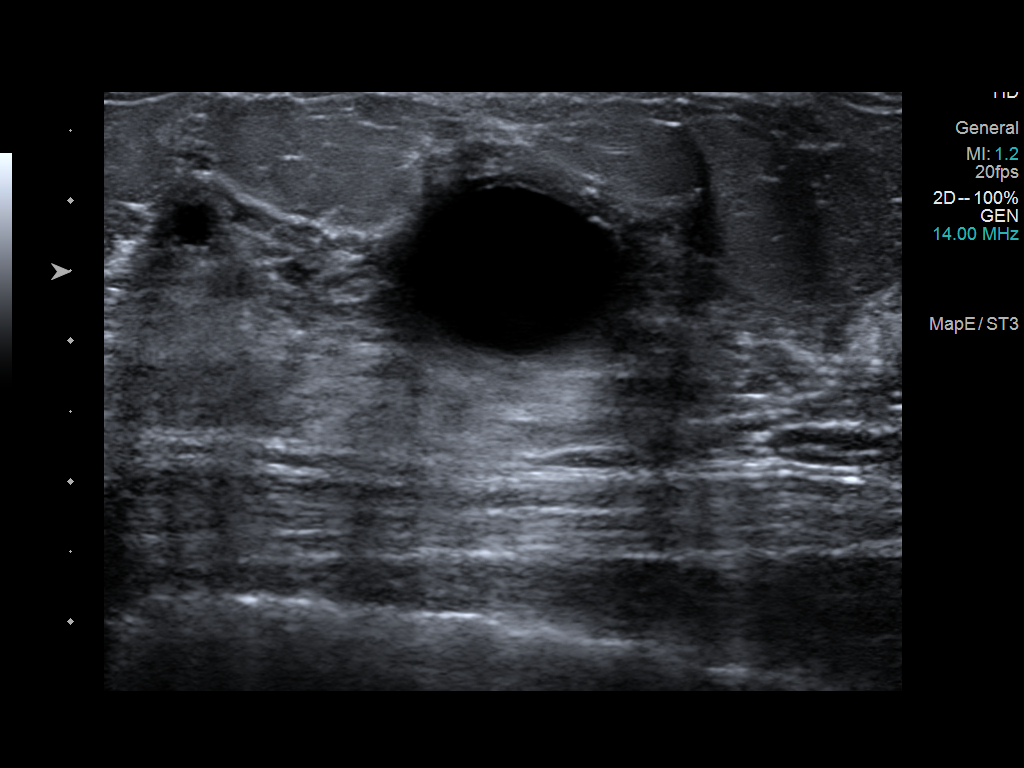

[6 of 6 positions shown; findings below may reference images not displayed]

ACR Breast Density Category c: The breast tissue is heterogeneously
dense, which may obscure small masses.
FINDINGS: There is a waxing and waning bilateral cystic pattern. No suspicious
masses identified.

On physical exam, a lump is felt on the left.

Targeted ultrasound is performed, showing a simple cyst correlating
with the patient's palpable lump.
IMPRESSION: Fibrocystic changes.  No evidence of malignancy.

RECOMMENDATION:
Annual screening mammography.

I have discussed the findings and recommendations with the patient.
Results were also provided in writing at the conclusion of the
visit. If applicable, a reminder letter will be sent to the patient
regarding the next appointment.

BI-RADS CATEGORY  2: Benign.

## 2018-12-15 DIAGNOSIS — Z6826 Body mass index (BMI) 26.0-26.9, adult: Secondary | ICD-10-CM | POA: Diagnosis not present

## 2018-12-15 DIAGNOSIS — Z01419 Encounter for gynecological examination (general) (routine) without abnormal findings: Secondary | ICD-10-CM | POA: Diagnosis not present

## 2018-12-15 DIAGNOSIS — Z1231 Encounter for screening mammogram for malignant neoplasm of breast: Secondary | ICD-10-CM | POA: Diagnosis not present

## 2019-03-08 DIAGNOSIS — Z85828 Personal history of other malignant neoplasm of skin: Secondary | ICD-10-CM | POA: Diagnosis not present

## 2019-03-08 DIAGNOSIS — L819 Disorder of pigmentation, unspecified: Secondary | ICD-10-CM | POA: Diagnosis not present

## 2019-03-08 DIAGNOSIS — D224 Melanocytic nevi of scalp and neck: Secondary | ICD-10-CM | POA: Diagnosis not present

## 2019-03-08 DIAGNOSIS — D2262 Melanocytic nevi of left upper limb, including shoulder: Secondary | ICD-10-CM | POA: Diagnosis not present
